# Patient Record
Sex: Female | Born: 1955 | Race: White | Hispanic: No | Marital: Married | Smoking: Never smoker
Health system: Southern US, Community
[De-identification: ages and names within clinical notes are randomized; demographics above are authoritative.]

## PROBLEM LIST (undated history)

## (undated) DIAGNOSIS — E063 Autoimmune thyroiditis: Secondary | ICD-10-CM

## (undated) DIAGNOSIS — E785 Hyperlipidemia, unspecified: Secondary | ICD-10-CM

## (undated) DIAGNOSIS — E119 Type 2 diabetes mellitus without complications: Secondary | ICD-10-CM

## (undated) HISTORY — DX: Hyperlipidemia, unspecified: E78.5

## (undated) HISTORY — PX: ABDOMINAL HYSTERECTOMY: SHX81

## (undated) HISTORY — PX: APPENDECTOMY: SHX54

## (undated) HISTORY — PX: CHOLECYSTECTOMY: SHX55

## (undated) HISTORY — PX: TONSILLECTOMY: SUR1361

## (undated) HISTORY — PX: THYROID SURGERY: SHX805

## (undated) HISTORY — DX: Type 2 diabetes mellitus without complications: E11.9

---

## 2009-04-12 DIAGNOSIS — E119 Type 2 diabetes mellitus without complications: Secondary | ICD-10-CM | POA: Insufficient documentation

## 2021-04-14 ENCOUNTER — Other Ambulatory Visit: Payer: Self-pay

## 2021-04-14 ENCOUNTER — Encounter: Payer: Self-pay | Admitting: Family Medicine

## 2021-04-14 ENCOUNTER — Ambulatory Visit (INDEPENDENT_AMBULATORY_CARE_PROVIDER_SITE_OTHER): Payer: Medicare Other | Admitting: Family Medicine

## 2021-04-14 VITALS — BP 103/70 | HR 69 | Temp 98.0°F | Resp 16 | Ht 69.0 in | Wt 168.8 lb

## 2021-04-14 DIAGNOSIS — Z7689 Persons encountering health services in other specified circumstances: Secondary | ICD-10-CM

## 2021-04-14 DIAGNOSIS — Z23 Encounter for immunization: Secondary | ICD-10-CM | POA: Diagnosis not present

## 2021-04-14 DIAGNOSIS — M79605 Pain in left leg: Secondary | ICD-10-CM | POA: Diagnosis not present

## 2021-04-14 DIAGNOSIS — E1169 Type 2 diabetes mellitus with other specified complication: Secondary | ICD-10-CM | POA: Diagnosis not present

## 2021-04-14 DIAGNOSIS — E063 Autoimmune thyroiditis: Secondary | ICD-10-CM

## 2021-04-14 LAB — POCT GLYCOSYLATED HEMOGLOBIN (HGB A1C): Hemoglobin A1C: 8.4 % — AB (ref 4.0–5.6)

## 2021-04-14 MED ORDER — METFORMIN HCL 1000 MG PO TABS: 1000.0000 mg | ORAL_TABLET | Freq: Two times a day (BID) | ORAL | 1 refills | Status: AC

## 2021-04-14 MED ORDER — LEVOTHYROXINE SODIUM 88 MCG PO TABS: 88.0000 ug | ORAL_TABLET | Freq: Every day | ORAL | 1 refills | Status: AC

## 2021-04-14 MED ORDER — PRAVASTATIN SODIUM 20 MG PO TABS: 20.0000 mg | ORAL_TABLET | Freq: Every day | ORAL | 1 refills | Status: AC

## 2021-04-14 MED ORDER — GLIPIZIDE 10 MG PO TABS: 10.0000 mg | ORAL_TABLET | Freq: Two times a day (BID) | ORAL | 1 refills | Status: AC

## 2021-04-14 NOTE — Progress Notes (Signed)
Patient is hereto est care with provider.

## 2021-04-14 NOTE — Progress Notes (Signed)
New Patient Office Visit  Subjective:  Patient ID: Allison Turner, female    DOB: [birth year]  Age: 65 y.o. MRN: [id number]  CC:  Chief Complaint  Patient presents with   Establish Care    HPI Allison Turner presents for to establish care and for review of chronic med issues with med refills. Patient has recently relocated to the area from [place]. She also reports some intermittent left leg pain and swelling for about 2 weeks. She denies known trauma or injury.    Past Medical History:  Diagnosis Date   Diabetes mellitus without complication (HCC)    Hyperlipidemia     Past Surgical History:  Procedure Laterality Date   ABDOMINAL HYSTERECTOMY     APPENDECTOMY     CHOLECYSTECTOMY     THYROID SURGERY      History reviewed. No pertinent family history.  Social History   Socioeconomic History   Marital status: Married    Spouse name: Not on file   Number of children: Not on file   Years of education: Not on file   Highest education level: Not on file  Occupational History   Not on file  Tobacco Use   Smoking status: Never   Smokeless tobacco: Never  Substance and Sexual Activity   Alcohol use: Not on file   Drug use: Not on file   Sexual activity: Not on file  Other Topics Concern   Not on file  Social History Narrative   Not on file   Social Determinants of Health   Financial Resource Strain: Not on file  Food Insecurity: Not on file  Transportation Needs: Not on file  Physical Activity: Not on file  Stress: Not on file  Social Connections: Not on file  Intimate Partner Violence: Not on file    ROS Review of Systems  Respiratory: Negative.    Cardiovascular: Negative.   All other systems reviewed and are negative.  Objective:   Today's Vitals: BP 103/70   Pulse 69   Temp 98 F (36.7 C) (Oral)   Resp 16   Ht 5\' 9"  (1.753 m)   Wt 168 lb 12.8 oz (76.6 kg)   BMI 24.93 kg/m   Physical Exam Vitals and nursing note reviewed.  Constitutional:       General: She is not in acute distress. Neck:     Thyroid: No thyromegaly.  Cardiovascular:     Rate and Rhythm: Normal rate and regular rhythm.  Pulmonary:     Effort: Pulmonary effort is normal.     Breath sounds: Normal breath sounds.  Abdominal:     Palpations: Abdomen is soft.     Tenderness: There is no abdominal tenderness.  Musculoskeletal:     Cervical back: Normal range of motion and neck supple.     Right lower leg: No edema.     Left lower leg: No tenderness. No edema.  Neurological:     General: No focal deficit present.     Mental Status: She is alert and oriented to person, place, and time.    Assessment & Plan:   1. Type 2 diabetes mellitus with other specified complication, without long-term current use of insulin (HCC) A1c elevated above goal. Discussed dietary and activity options. Monitoring labs ordered. Continue present management. Meds refilled - Basic Metabolic Panel - AST - ALT - Lipid Panel - POCT glycosylated hemoglobin (Hb A1C)  2. Hashimoto's disease Appears stable. Monitoring labs ordered. Continue present management meds refilled. - TSH -  T4, Free  3. Left leg pain Referral to vascular for further eval/mgt - Ambulatory referral to Vascular Surgery  4. Need for influenza vaccination  - Flu Vaccine QUAD 82mo+IM (Fluarix, Fluzone & Alfiuria Quad PF)  5. Encounter to establish care     Outpatient Encounter Medications as of 04/14/2021  Medication Sig   glipiZIDE (GLUCOTROL) 5 MG tablet    metFORMIN (GLUCOPHAGE) 1000 MG tablet    Thyroid (LEVOTHYROXINE-LIOTHYRONINE PO)    [DISCONTINUED] Simvastatin 40 MG/5ML SUSP    No facility-administered encounter medications on file as of 04/14/2021.    Follow-up: Return in about 3 months (around 07/15/2021) for follow up, chronic med issues.   Allison Raymond, MD

## 2021-04-15 LAB — BASIC METABOLIC PANEL
BUN/Creatinine Ratio: 20 (ref 12–28)
BUN: 14 mg/dL (ref 8–27)
CO2: 26 mmol/L (ref 20–29)
Calcium: 9.8 mg/dL (ref 8.7–10.3)
Chloride: 101 mmol/L (ref 96–106)
Creatinine, Ser: 0.7 mg/dL (ref 0.57–1.00)
Glucose: 259 mg/dL — ABNORMAL HIGH (ref 70–99)
Potassium: 4.8 mmol/L (ref 3.5–5.2)
Sodium: 139 mmol/L (ref 134–144)
eGFR: 95 mL/min/{1.73_m2} (ref >59)

## 2021-04-15 LAB — LIPID PANEL
Chol/HDL Ratio: 2.5 ratio (ref 0.0–4.4)
Cholesterol, Total: 140 mg/dL (ref 100–199)
HDL: 55 mg/dL (ref >39)
LDL Chol Calc (NIH): 59 mg/dL (ref 0–99)
Triglycerides: 152 mg/dL — ABNORMAL HIGH (ref 0–149)
VLDL Cholesterol Cal: 26 mg/dL (ref 5–40)

## 2021-04-15 LAB — TSH: TSH: 1.85 u[IU]/mL (ref 0.450–4.500)

## 2021-04-15 LAB — ALT: ALT: 18 IU/L (ref 0–32)

## 2021-04-15 LAB — T4, FREE: Free T4: 1.62 ng/dL (ref 0.82–1.77)

## 2021-04-15 LAB — AST: AST: 18 IU/L (ref 0–40)

## 2021-04-16 NOTE — Progress Notes (Signed)
LVM on patient phone to call office

## 2021-04-21 ENCOUNTER — Other Ambulatory Visit: Payer: Self-pay

## 2021-04-21 DIAGNOSIS — M7989 Other specified soft tissue disorders: Secondary | ICD-10-CM

## 2021-05-14 ENCOUNTER — Ambulatory Visit (HOSPITAL_COMMUNITY)
Admission: RE | Admit: 2021-05-14 | Discharge: 2021-05-14 | Disposition: A | Discharge status: Home / Self Care | Payer: Medicare Other | Source: Ambulatory Visit | Attending: Vascular Surgery | Admitting: Vascular Surgery

## 2021-05-14 ENCOUNTER — Ambulatory Visit: Payer: Medicare Other | Admitting: Physician Assistant

## 2021-05-14 ENCOUNTER — Encounter: Payer: Self-pay | Admitting: Physician Assistant

## 2021-05-14 ENCOUNTER — Other Ambulatory Visit: Payer: Self-pay

## 2021-05-14 VITALS — BP 109/63 | HR 78 | Temp 98.6°F | Resp 20 | Ht 69.0 in | Wt 173.6 lb

## 2021-05-14 DIAGNOSIS — M7989 Other specified soft tissue disorders: Secondary | ICD-10-CM | POA: Insufficient documentation

## 2021-05-14 DIAGNOSIS — I872 Venous insufficiency (chronic) (peripheral): Secondary | ICD-10-CM

## 2021-05-14 NOTE — Progress Notes (Signed)
Requested by:  Georganna Skeans, MD [address] [place],  [place] [address]  Reason for consultation: LLE pain/ swelling    History of Present Illness   Allison Turner is a 65 y.o. (07/21/1955) female who presents for evaluation of LLE swelling and pain x 1 month. She says it is an aching pain that is worse at the end of the day and often keeps her awake at night. She has had varicose veins in her legs for approximately 1 year and they seem to be more prominent now. Most of her swelling is around her left knee and distal left thigh. She does not really notice any swelling in her lower leg or foot. She does elevate some which helps alleviate some of the aching. She has not worn compression stockings before. She denies any itching, burning, bleeding or ulceration. She explains that she has worked many years in [employer] and 4 months ago moved here from [facility]. She says she spends a lot of time sitting at a desk typing and sitting. She does walk daily for exercise. She does not have any history of DVT  Venous symptoms include: aching, throbbing,swelling Onset/duration:  1 month  Occupation:  Higher Education  Aggravating factors: sitting, standing Alleviating factors: elevation Compression:  no Helps:  unsure Pain medications:  none Previous vein procedures:  no History of DVT:  no  Past Medical History:  Diagnosis Date   Diabetes mellitus without complication (HCC)    Hyperlipidemia     Past Surgical History:  Procedure Laterality Date   ABDOMINAL HYSTERECTOMY     APPENDECTOMY     CHOLECYSTECTOMY     THYROID SURGERY      Social History   Socioeconomic History   Marital status: Married    Spouse name: Not on file   Number of children: Not on file   Years of education: Not on file   Highest education level: Not on file  Occupational History   Not on file  Tobacco Use   Smoking status: Never   Smokeless tobacco: Never  Substance and Sexual  Activity   Alcohol use: Not on file   Drug use: Not on file   Sexual activity: Not on file  Other Topics Concern   Not on file  Social History Narrative   Not on file   Social Determinants of Health   Financial Resource Strain: Not on file  Food Insecurity: Not on file  Transportation Needs: Not on file  Physical Activity: Not on file  Stress: Not on file  Social Connections: Not on file  Intimate Partner Violence: Not on file   No family history on file.  Current Outpatient Medications  Medication Sig Dispense Refill   glipiZIDE (GLUCOTROL) 10 MG tablet Take 1 tablet (10 mg total) by mouth 2 (two) times daily before a meal. 180 tablet 1   levothyroxine (SYNTHROID) 88 MCG tablet Take 1 tablet (88 mcg total) by mouth daily. 90 tablet 1   metFORMIN (GLUCOPHAGE) 1000 MG tablet Take 1 tablet (1,000 mg total) by mouth 2 (two) times daily with a meal. 180 tablet 1   pravastatin (PRAVACHOL) 20 MG tablet Take 1 tablet (20 mg total) by mouth daily. 90 tablet 1   No current facility-administered medications for this visit.    No Known Allergies  REVIEW OF SYSTEMS (negative unless checked):   Cardiac:  []  Chest pain or chest pressure? []  Shortness of breath upon activity? []  Shortness of breath when  lying flat? []  Irregular heart rhythm?  Vascular:  []  Pain in calf, thigh, or hip brought on by walking? []  Pain in feet at night that wakes you up from your sleep? []  Blood clot in your veins? []  Leg swelling?  Pulmonary:  []  Oxygen at home? []  Productive cough? []  Wheezing?  Neurologic:  []  Sudden weakness in arms or legs? []  Sudden numbness in arms or legs? []  Sudden onset of difficult speaking or slurred speech? []  Temporary loss of vision in one eye? []  Problems with dizziness?  Gastrointestinal:  []  Blood in stool? []  Vomited blood?  Genitourinary:  []  Burning when urinating? []  Blood in urine?  Psychiatric:  []  Major depression  Hematologic:  []  Bleeding  problems? []  Problems with blood clotting?  Dermatologic:  []  Rashes or ulcers?  Constitutional:  []  Fever or chills?  Ear/Nose/Throat:  []  Change in hearing? []  Nose bleeds? []  Sore throat?  Musculoskeletal:  []  Back pain? []  Joint pain? []  Muscle pain?   Physical Examination     Vitals:   05/14/21 1441  BP: 109/63  Pulse: 78  Resp: 20  Temp: 98.6 F (37 C)  TempSrc: Temporal  SpO2: 96%  Weight: 173 lb 9.6 oz (78.7 kg)  Height: 5\' 9"  (1.753 m)   Body mass index is 25.64 kg/m.  General:  WDWN in NAD; vital signs documented above Gait: Normal HENT: WNL, normocephalic Pulmonary: normal non-labored breathing , without  wheezing Cardiac: regular HR, without  Murmurs without carotid bruit Abdomen: soft, NT, no masses Vascular Exam/Pulses:  Right Left  Radial 2+ (normal) 2+ (normal)  Femoral 2+ (normal) 2+ (normal)  Popliteal 2+ (normal) 2+ (normal)  DP 2+ (normal) 2+ (normal)  PT 2+ (normal) 2+ (normal)   Extremities: large varicose vein of left anterior thigh that traverses her thigh, around her knee and across anterior lower leg. She also has some reticular veins on both right and left leg. She does have some fullness around anterior distal thigh and around knee. without edema, without stasis pigmentation, without lipodermatosclerosis, without ulcers Musculoskeletal: no muscle wasting or atrophy  Neurologic: A&O X 3;  No focal weakness or paresthesias are detected Psychiatric:  The pt has Normal affect.  Non-invasive Vascular Imaging   BLE Venous Insufficiency Duplex (05/14/21): LLE: DVT and SVT GSV reflux SFJ and proximal thigh GSV diameter 0.21-0.37 cm SSV reflux mid calf No deep venous reflux AASV with reflux, branches off the saphenous vein in the groin and becomes varicose and tortuous in the thigh   Medical Decision Making   Allison Turner is a 65 y.o. female who presents with: LLE chronic venous insufficiency with leg pain and swelling. Her duplex  today shows no DVT or SVT. 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 However her veins are very small so she is not a candidate for a lazer ablation. I discussed possibility of stab phlebectomy with her but explained that because her varicose veins are not themselves causing her pain that I don't think this would really benefit her.  She is not interested in stab phlebectomy at this time. Based on the patient's history and examination, I recommend compression with 20-30 mmHg stockings. Due to location of her symptoms I think she would benefit from thigh high compression, but discussed with her that she could alternate between knee and thigh high as needed. Discussed daily elevation for 30 minutes above level of her heart. I also recommended continued walking regimen and refraining from prolonged sitting  or standing She can follow up as needed if she has new or concerning symptoms    Graceann Congress, PA-C [facility] Office: [phone]  05/14/2021, 2:49 PM  Clinic MD: Dickson/ Randie Heinz

## 2021-07-15 ENCOUNTER — Encounter: Payer: Medicare Other | Admitting: Family Medicine

## 2021-10-02 ENCOUNTER — Other Ambulatory Visit: Payer: Self-pay | Admitting: Internal Medicine

## 2021-10-02 DIAGNOSIS — Z1231 Encounter for screening mammogram for malignant neoplasm of breast: Secondary | ICD-10-CM

## 2021-10-12 ENCOUNTER — Ambulatory Visit
Admission: RE | Admit: 2021-10-12 | Discharge: 2021-10-12 | Disposition: A | Discharge status: Home / Self Care | Payer: Medicare Other | Source: Ambulatory Visit | Attending: Internal Medicine | Admitting: Internal Medicine

## 2021-10-12 DIAGNOSIS — Z1231 Encounter for screening mammogram for malignant neoplasm of breast: Secondary | ICD-10-CM

## 2022-07-30 ENCOUNTER — Other Ambulatory Visit (HOSPITAL_COMMUNITY): Payer: Self-pay | Admitting: Internal Medicine

## 2022-07-30 ENCOUNTER — Ambulatory Visit (HOSPITAL_COMMUNITY)
Admission: RE | Admit: 2022-07-30 | Discharge: 2022-07-30 | Disposition: A | Discharge status: Home / Self Care | Payer: Medicare Other | Source: Ambulatory Visit | Attending: Internal Medicine | Admitting: Internal Medicine

## 2022-07-30 DIAGNOSIS — R1011 Right upper quadrant pain: Secondary | ICD-10-CM

## 2022-08-02 ENCOUNTER — Other Ambulatory Visit: Payer: Self-pay | Admitting: Internal Medicine

## 2022-08-02 DIAGNOSIS — K862 Cyst of pancreas: Secondary | ICD-10-CM

## 2022-08-24 ENCOUNTER — Ambulatory Visit
Admission: RE | Admit: 2022-08-24 | Discharge: 2022-08-24 | Disposition: A | Discharge status: Home / Self Care | Payer: Medicare Other | Source: Ambulatory Visit | Attending: Internal Medicine | Admitting: Internal Medicine

## 2022-08-24 DIAGNOSIS — K862 Cyst of pancreas: Secondary | ICD-10-CM

## 2022-08-24 MED ORDER — GADOPICLENOL 0.5 MMOL/ML IV SOLN
7.0000 mL | Freq: Once | INTRAVENOUS | Status: AC | PRN
  Administered 2022-08-24: 7 mL via INTRAVENOUS

## 2022-12-02 ENCOUNTER — Other Ambulatory Visit: Payer: Self-pay | Admitting: Internal Medicine

## 2022-12-02 DIAGNOSIS — Z1231 Encounter for screening mammogram for malignant neoplasm of breast: Secondary | ICD-10-CM

## 2022-12-23 ENCOUNTER — Ambulatory Visit
Admission: RE | Admit: 2022-12-23 | Discharge: 2022-12-23 | Disposition: A | Discharge status: Home / Self Care | Payer: Medicare Other | Source: Ambulatory Visit | Attending: Internal Medicine | Admitting: Internal Medicine

## 2022-12-23 DIAGNOSIS — Z1231 Encounter for screening mammogram for malignant neoplasm of breast: Secondary | ICD-10-CM

## 2022-12-27 ENCOUNTER — Other Ambulatory Visit: Payer: Self-pay | Admitting: Internal Medicine

## 2022-12-27 DIAGNOSIS — R928 Other abnormal and inconclusive findings on diagnostic imaging of breast: Secondary | ICD-10-CM

## 2023-01-13 ENCOUNTER — Ambulatory Visit
Admission: RE | Admit: 2023-01-13 | Discharge: 2023-01-13 | Disposition: A | Discharge status: Home / Self Care | Payer: Medicare Other | Source: Ambulatory Visit | Attending: Internal Medicine | Admitting: Internal Medicine

## 2023-01-13 ENCOUNTER — Other Ambulatory Visit: Payer: Self-pay | Admitting: Internal Medicine

## 2023-01-13 DIAGNOSIS — R928 Other abnormal and inconclusive findings on diagnostic imaging of breast: Secondary | ICD-10-CM

## 2023-04-28 ENCOUNTER — Other Ambulatory Visit (HOSPITAL_BASED_OUTPATIENT_CLINIC_OR_DEPARTMENT_OTHER): Payer: Self-pay

## 2023-04-28 ENCOUNTER — Emergency Department (HOSPITAL_BASED_OUTPATIENT_CLINIC_OR_DEPARTMENT_OTHER)
Admission: EM | Admit: 2023-04-28 | Discharge: 2023-04-28 | Disposition: A | Discharge status: Home / Self Care | Payer: Medicare Other | Attending: Emergency Medicine | Admitting: Emergency Medicine

## 2023-04-28 ENCOUNTER — Encounter (HOSPITAL_BASED_OUTPATIENT_CLINIC_OR_DEPARTMENT_OTHER): Payer: Self-pay | Admitting: Emergency Medicine

## 2023-04-28 ENCOUNTER — Other Ambulatory Visit: Payer: Self-pay

## 2023-04-28 ENCOUNTER — Emergency Department (HOSPITAL_BASED_OUTPATIENT_CLINIC_OR_DEPARTMENT_OTHER): Payer: Medicare Other

## 2023-04-28 DIAGNOSIS — Z7984 Long term (current) use of oral hypoglycemic drugs: Secondary | ICD-10-CM | POA: Insufficient documentation

## 2023-04-28 DIAGNOSIS — R519 Headache, unspecified: Secondary | ICD-10-CM | POA: Insufficient documentation

## 2023-04-28 DIAGNOSIS — E119 Type 2 diabetes mellitus without complications: Secondary | ICD-10-CM | POA: Insufficient documentation

## 2023-04-28 DIAGNOSIS — R531 Weakness: Secondary | ICD-10-CM | POA: Diagnosis not present

## 2023-04-28 DIAGNOSIS — R197 Diarrhea, unspecified: Secondary | ICD-10-CM | POA: Diagnosis not present

## 2023-04-28 DIAGNOSIS — Z20822 Contact with and (suspected) exposure to covid-19: Secondary | ICD-10-CM | POA: Insufficient documentation

## 2023-04-28 HISTORY — DX: Autoimmune thyroiditis: E06.3

## 2023-04-28 LAB — CBC WITH DIFFERENTIAL/PLATELET
Abs Immature Granulocytes: 0.03 10*3/uL (ref 0.00–0.07)
Basophils Absolute: 0 10*3/uL (ref 0.0–0.1)
Basophils Relative: 0 %
Eosinophils Absolute: 0.4 10*3/uL (ref 0.0–0.5)
Eosinophils Relative: 6 %
HCT: 40.1 % (ref 36.0–46.0)
Hemoglobin: 13.9 g/dL (ref 12.0–15.0)
Immature Granulocytes: 0 %
Lymphocytes Relative: 23 %
Lymphs Abs: 1.6 10*3/uL (ref 0.7–4.0)
MCH: 32 pg (ref 26.0–34.0)
MCHC: 34.7 g/dL (ref 30.0–36.0)
MCV: 92.4 fL (ref 80.0–100.0)
Monocytes Absolute: 0.5 10*3/uL (ref 0.1–1.0)
Monocytes Relative: 7 %
Neutro Abs: 4.5 10*3/uL (ref 1.7–7.7)
Neutrophils Relative %: 64 %
Platelets: 230 10*3/uL (ref 150–400)
RBC: 4.34 MIL/uL (ref 3.87–5.11)
RDW: 12.9 % (ref 11.5–15.5)
WBC: 7 10*3/uL (ref 4.0–10.5)
nRBC: 0 % (ref 0.0–0.2)

## 2023-04-28 LAB — URINALYSIS, W/ REFLEX TO CULTURE (INFECTION SUSPECTED)
Bilirubin Urine: NEGATIVE
Glucose, UA: NEGATIVE mg/dL
Hgb urine dipstick: NEGATIVE
Ketones, ur: NEGATIVE mg/dL
Leukocytes,Ua: NEGATIVE
Nitrite: NEGATIVE
Protein, ur: NEGATIVE mg/dL
RBC / HPF: NONE SEEN RBC/hpf (ref 0–5)
Specific Gravity, Urine: 1.01 (ref 1.005–1.030)
WBC, UA: NONE SEEN WBC/hpf (ref 0–5)
pH: 6.5 (ref 5.0–8.0)

## 2023-04-28 LAB — I-STAT VENOUS BLOOD GAS, ED
Acid-Base Excess: 0 mmol/L (ref 0.0–2.0)
Bicarbonate: 24.7 mmol/L (ref 20.0–28.0)
Calcium, Ion: 1.06 mmol/L — ABNORMAL LOW (ref 1.15–1.40)
HCT: 41 % (ref 36.0–46.0)
Hemoglobin: 13.9 g/dL (ref 12.0–15.0)
O2 Saturation: 99 %
Patient temperature: 98.6
Potassium: 4.2 mmol/L (ref 3.5–5.1)
Sodium: 137 mmol/L (ref 135–145)
TCO2: 26 mmol/L (ref 22–32)
pCO2, Ven: 37.8 mm[Hg] — ABNORMAL LOW (ref 44–60)
pH, Ven: 7.423 (ref 7.25–7.43)
pO2, Ven: 152 mm[Hg] — ABNORMAL HIGH (ref 32–45)

## 2023-04-28 LAB — COMPREHENSIVE METABOLIC PANEL
ALT: 29 U/L (ref 0–44)
AST: 28 U/L (ref 15–41)
Albumin: 4.4 g/dL (ref 3.5–5.0)
Alkaline Phosphatase: 51 U/L (ref 38–126)
Anion gap: 7 (ref 5–15)
BUN: 21 mg/dL (ref 8–23)
CO2: 29 mmol/L (ref 22–32)
Calcium: 9.3 mg/dL (ref 8.9–10.3)
Chloride: 101 mmol/L (ref 98–111)
Creatinine, Ser: 0.66 mg/dL (ref 0.44–1.00)
GFR, Estimated: >60 mL/min (ref >60)
Glucose, Bld: 123 mg/dL — ABNORMAL HIGH (ref 70–99)
Potassium: 3.7 mmol/L (ref 3.5–5.1)
Sodium: 137 mmol/L (ref 135–145)
Total Bilirubin: 0.7 mg/dL (ref <1.2)
Total Protein: 7.3 g/dL (ref 6.5–8.1)

## 2023-04-28 LAB — SARS CORONAVIRUS 2 BY RT PCR: SARS Coronavirus 2 by RT PCR: NEGATIVE

## 2023-04-28 LAB — CBG MONITORING, ED: Glucose-Capillary: 150 mg/dL — ABNORMAL HIGH (ref 70–99)

## 2023-04-28 MED ORDER — LACTATED RINGERS IV BOLUS
1000.0000 mL | Freq: Once | INTRAVENOUS | Status: AC
  Administered 2023-04-28: 1000 mL via INTRAVENOUS

## 2023-04-28 MED ORDER — BLOOD GLUCOSE MONITOR SYSTEM W/DEVICE KIT
1.0000 | PACK | Freq: Three times a day (TID) | 0 refills | Status: AC
  Filled 2023-04-28: qty 1, 30d supply, fill #0

## 2023-04-28 MED ORDER — LANCET DEVICE MISC
1.0000 | Freq: Three times a day (TID) | 0 refills | Status: AC
  Filled 2023-04-28: qty 1, 30d supply, fill #0

## 2023-04-28 MED ORDER — BLOOD GLUCOSE TEST VI STRP
1.0000 | ORAL_STRIP | Freq: Three times a day (TID) | 0 refills | Status: AC
  Filled 2023-04-28: qty 100, 30d supply, fill #0

## 2023-04-28 MED ORDER — ACCU-CHEK SOFTCLIX LANCETS MISC
1.0000 | Freq: Three times a day (TID) | 0 refills | Status: AC
  Filled 2023-04-28: qty 100, 30d supply, fill #0

## 2023-04-28 NOTE — ED Notes (Signed)
Patient transported to CT 

## 2023-04-28 NOTE — ED Notes (Signed)
Pt alert and oriented X 4 at the time of discharge. RR even and unlabored. No acute distress noted. Pt verbalized understanding of discharge instructions as discussed. Pt ambulatory to lobby at time of discharge.

## 2023-04-28 NOTE — ED Provider Notes (Signed)
[facility] Provider Note   CSN: [id number] Arrival date & time: 04/28/23  4403     History  Chief Complaint  Patient presents with   Hyperglycemia    Allison Turner is a 67 y.o. female.  HPI     Feeling sick, headache, flushed cheeks Started day before yesterday Headache feels like "booming" 5/10. Nothing makes it better or worse. No nausea or vomiting Has had diarrhea started the day before yesterday, 3-4 per day. No black or bloody stools. No fever, cough, congestion Denies numbness, weakness, difficulty talking or walking, visual changes or facial droop.   Headache not worse with bright lights or loud sounds. Don't get headaches often. Flushed, cheeks becoming red at home intermittently, will get that when sick. Gets it when has covid and when glucose is high. Both glucometers were registering glucose in 400s and never came below to 264 or 340s. Could not get sugar low yesterday and this AM at 5 was 340s according to those registers Feeling weak and jittery  Past Medical History:  Diagnosis Date   Diabetes mellitus without complication (HCC)    Hashimoto's disease    Hyperlipidemia     Home Medications Prior to Admission medications   Medication Sig Start Date End Date Taking? Authorizing Provider  Blood Glucose Monitoring Suppl DEVI 1 each by Does not apply route in the morning, at noon, and at bedtime. May substitute to any manufacturer covered by patient's insurance. 04/28/23  Yes Alvira Monday, MD  Glucose Blood (BLOOD GLUCOSE TEST STRIPS) STRP 1 each by In Vitro route in the morning, at noon, and at bedtime. May substitute to any manufacturer covered by patient's insurance. 04/28/23 05/28/23 Yes Alvira Monday, MD  Lancet Device MISC 1 each by Does not apply route in the morning, at noon, and at bedtime. May substitute to any manufacturer covered by patient's insurance. 04/28/23 05/28/23 Yes Alvira Monday, MD   Lancets Misc. MISC 1 each by Does not apply route in the morning, at noon, and at bedtime. May substitute to any manufacturer covered by patient's insurance. 04/28/23 05/28/23 Yes Alvira Monday, MD  glipiZIDE (GLUCOTROL) 10 MG tablet Take 1 tablet (10 mg total) by mouth 2 (two) times daily before a meal. 04/14/21   Georganna Skeans, MD  levothyroxine (SYNTHROID) 88 MCG tablet Take 1 tablet (88 mcg total) by mouth daily. 04/14/21   Georganna Skeans, MD  metFORMIN (GLUCOPHAGE) 1000 MG tablet Take 1 tablet (1,000 mg total) by mouth 2 (two) times daily with a meal. 04/14/21   Georganna Skeans, MD  pravastatin (PRAVACHOL) 20 MG tablet Take 1 tablet (20 mg total) by mouth daily. 04/14/21   Georganna Skeans, MD      Allergies    Erythromycin    Review of Systems   Review of Systems  Physical Exam Updated Vital Signs BP (!) 112/45   Pulse (!) 51   Temp 98.2 F (36.8 C) (Oral)   Resp 18   Ht 5' 8.5" (1.74 m)   Wt 70.3 kg   SpO2 100%   BMI 23.22 kg/m  Physical Exam Vitals and nursing note reviewed.  Constitutional:      General: She is not in acute distress.    Appearance: Normal appearance. She is well-developed. She is not ill-appearing or diaphoretic.  HENT:     Head: Normocephalic and atraumatic.  Eyes:     General: No visual field deficit.    Extraocular Movements: Extraocular movements intact.  Conjunctiva/sclera: Conjunctivae normal.     Pupils: Pupils are equal, round, and reactive to light.  Cardiovascular:     Rate and Rhythm: Normal rate and regular rhythm.     Pulses: Normal pulses.     Heart sounds: Normal heart sounds. No murmur heard.    No friction rub. No gallop.  Pulmonary:     Effort: Pulmonary effort is normal. No respiratory distress.     Breath sounds: Normal breath sounds. No wheezing or rales.  Abdominal:     General: There is no distension.     Palpations: Abdomen is soft.     Tenderness: There is no abdominal tenderness. There is no guarding.   Musculoskeletal:        General: No swelling or tenderness.     Cervical back: Normal range of motion.  Skin:    General: Skin is warm and dry.     Findings: No erythema or rash.  Neurological:     General: No focal deficit present.     Mental Status: She is alert and oriented to person, place, and time.     GCS: GCS eye subscore is 4. GCS verbal subscore is 5. GCS motor subscore is 6.     Cranial Nerves: No cranial nerve deficit, dysarthria or facial asymmetry.     Sensory: No sensory deficit.     Motor: No weakness or tremor.     Coordination: Coordination normal. Finger-Nose-Finger Test normal.     Gait: Gait normal.     ED Results / Procedures / Treatments   Labs (all labs ordered are listed, but only abnormal results are displayed) Labs Reviewed  COMPREHENSIVE METABOLIC PANEL - Abnormal; Notable for the following components:      Result Value   Glucose, Bld 123 (*)    All other components within normal limits  URINALYSIS, W/ REFLEX TO CULTURE (INFECTION SUSPECTED) - Abnormal; Notable for the following components:   Color, Urine STRAW (*)    Bacteria, UA RARE (*)    All other components within normal limits  CBG MONITORING, ED - Abnormal; Notable for the following components:   Glucose-Capillary 150 (*)    All other components within normal limits  I-STAT VENOUS BLOOD GAS, ED - Abnormal; Notable for the following components:   pCO2, Ven 37.8 (*)    pO2, Ven 152 (*)    Calcium, Ion 1.06 (*)    All other components within normal limits  SARS CORONAVIRUS 2 BY RT PCR  CBC WITH DIFFERENTIAL/PLATELET    EKG EKG Interpretation Date/Time:  Thursday April 28 2023 09:42:31 EST Ventricular Rate:  54 PR Interval:  169 QRS Duration:  121 QT Interval:  478 QTC Calculation: 453 R Axis:   -54  Text Interpretation: Sinus rhythm Nonspecific IVCD with LAD Consider anterior infarct No No previous ECGs available Confirmed by Alvira Monday ([id number]) on 04/28/2023 9:48:23  AM  Radiology CT Head Wo Contrast  Result Date: 04/28/2023 CLINICAL DATA:  Headache, new onset.  Headaches for 2 days. EXAM: CT HEAD WITHOUT CONTRAST TECHNIQUE: Contiguous axial images were obtained from the base of the skull through the vertex without intravenous contrast. RADIATION DOSE REDUCTION: This exam was performed according to the departmental dose-optimization program which includes automated exposure control, adjustment of the mA and/or kV according to patient size and/or use of iterative reconstruction technique. COMPARISON:  None Available. FINDINGS: Brain: No acute infarct, hemorrhage, or mass lesion is present. No significant white matter lesions are present. Deep brain nuclei are  within normal limits. The ventricles are of normal size. No significant extraaxial fluid collection is present. The brainstem and cerebellum are within normal limits. Midline structures are within normal limits. Vascular: No hyperdense vessel or unexpected calcification. Skull: Calvarium is intact. No focal lytic or blastic lesions are present. No significant extracranial soft tissue lesion is present. Sinuses/Orbits: The paranasal sinuses and mastoid air cells are clear. The globes and orbits are within normal limits. IMPRESSION: Normal CT appearance of the brain. Electronically Signed   By: Marin Roberts M.D.   On: 04/28/2023 10:12    Procedures Procedures    Medications Ordered in ED Medications  lactated ringers bolus 1,000 mL (1,000 mLs Intravenous New Bag/Given 04/28/23 0908)    ED Course/ Medical Decision Making/ A&P                                  67 year old female with a history of type 2 diabetes, hyperlipidemia, Hashimoto's who presents with concern for high blood sugars at home, headache, flushing of the cheeks, diarrhea, generalized weakness.  Differential diagnosis includes electrolyte abnormalities, DKA, HHS, dehydration, anemia, UTI, ICH, viral etiology, thyroid abnormality.   Vital signs at this time do not suggest thyroid emergency.  No focal abnormalities on exam and have low suspicion for CVA.  No abdominal pain or tenderness.  Labs completed and personally about interpreted by me include i-STAT venous blood gas to evaluate for signs of DKA which showed a normal pH, no signs of DKA.  CBC with no leukocytosis, no anemia.  CMP without hyperglycemia or electrolyte abnormalities.   Urinalysis shows no sign of urinary tract infection  CT head completed given new onset of throbbing headache shows no evidence of intracranial hemorrhage or other acute abnormalities  EKG completed given history of generalized weakness was personally about interpreted by me and shows sinus rhythm with a rate of 54, no acute ST changes.  COVID-19 testing negative.  Discussed possible viral etiology of her symptoms of headache, diarrhea, generalized weakness.  Will give her a new prescription for glucose strips as it is possible that her strips are expired and this led to falsely high levels.  Also discussed possibility that she had hyperglycemia at home that improved prior to arrival.  Given prescription for new test strips and meter, recommend PCP follow-up. Patient discharged in stable condition with understanding of reasons to return.           Final Clinical Impression(s) / ED Diagnoses Final diagnoses:  Nonintractable headache, unspecified chronicity pattern, unspecified headache type  Generalized weakness  Diarrhea, unspecified type    Rx / DC Orders ED Discharge Orders          Ordered    Blood Glucose Monitoring Suppl DEVI  3 times daily        04/28/23 1032    Glucose Blood (BLOOD GLUCOSE TEST STRIPS) STRP  3 times daily        04/28/23 1032    Lancet Device MISC  3 times daily        04/28/23 1032    Lancets Misc. MISC  3 times daily        04/28/23 1032              Alvira Monday, MD 04/28/23 [phone]

## 2023-04-28 NOTE — ED Triage Notes (Addendum)
States, " I don't feel well and my BS yesterday was over 400 and this  am was 340" Diarrhea yesterrday, none today but has a h/a . Taking oral meds for NIDDM

## 2023-04-29 ENCOUNTER — Other Ambulatory Visit (HOSPITAL_BASED_OUTPATIENT_CLINIC_OR_DEPARTMENT_OTHER): Payer: Self-pay

## 2023-05-03 ENCOUNTER — Other Ambulatory Visit (HOSPITAL_COMMUNITY): Payer: Self-pay | Admitting: Internal Medicine

## 2023-05-03 DIAGNOSIS — R9431 Abnormal electrocardiogram [ECG] [EKG]: Secondary | ICD-10-CM

## 2023-06-07 ENCOUNTER — Ambulatory Visit (HOSPITAL_COMMUNITY): Payer: Medicare Other | Attending: Internal Medicine

## 2023-06-07 DIAGNOSIS — R9431 Abnormal electrocardiogram [ECG] [EKG]: Secondary | ICD-10-CM | POA: Diagnosis present

## 2023-06-07 LAB — ECHOCARDIOGRAM COMPLETE
Area-P 1/2: 4.8 cm2
S' Lateral: 2.6 cm

## 2024-01-02 ENCOUNTER — Encounter: Payer: Self-pay | Admitting: Internal Medicine

## 2024-01-02 DIAGNOSIS — Z1231 Encounter for screening mammogram for malignant neoplasm of breast: Secondary | ICD-10-CM

## 2024-01-03 ENCOUNTER — Other Ambulatory Visit: Payer: Self-pay | Admitting: Internal Medicine

## 2024-01-03 DIAGNOSIS — N644 Mastodynia: Secondary | ICD-10-CM

## 2024-01-11 ENCOUNTER — Ambulatory Visit
Admission: RE | Admit: 2024-01-11 | Discharge: 2024-01-11 | Disposition: A | Discharge status: Home / Self Care | Source: Ambulatory Visit | Attending: Internal Medicine | Admitting: Internal Medicine

## 2024-01-11 ENCOUNTER — Ambulatory Visit

## 2024-01-11 DIAGNOSIS — N644 Mastodynia: Secondary | ICD-10-CM

## 2024-02-26 IMAGING — MG MM DIGITAL SCREENING BILAT W/ TOMO AND CAD
6 of 10 series · 6 of 30 positions shown · non-contrast
Comparison: Previous exam(s).

CLINICAL DATA: Screening.

EXAM:
DIGITAL SCREENING BILATERAL MAMMOGRAM WITH TOMOSYNTHESIS AND CAD
TECHNIQUE: Bilateral screening digital craniocaudal and mediolateral oblique
mammograms were obtained. Bilateral screening digital breast
tomosynthesis was performed. The images were evaluated with
computer-aided detection.

[L MLO synth-2D]
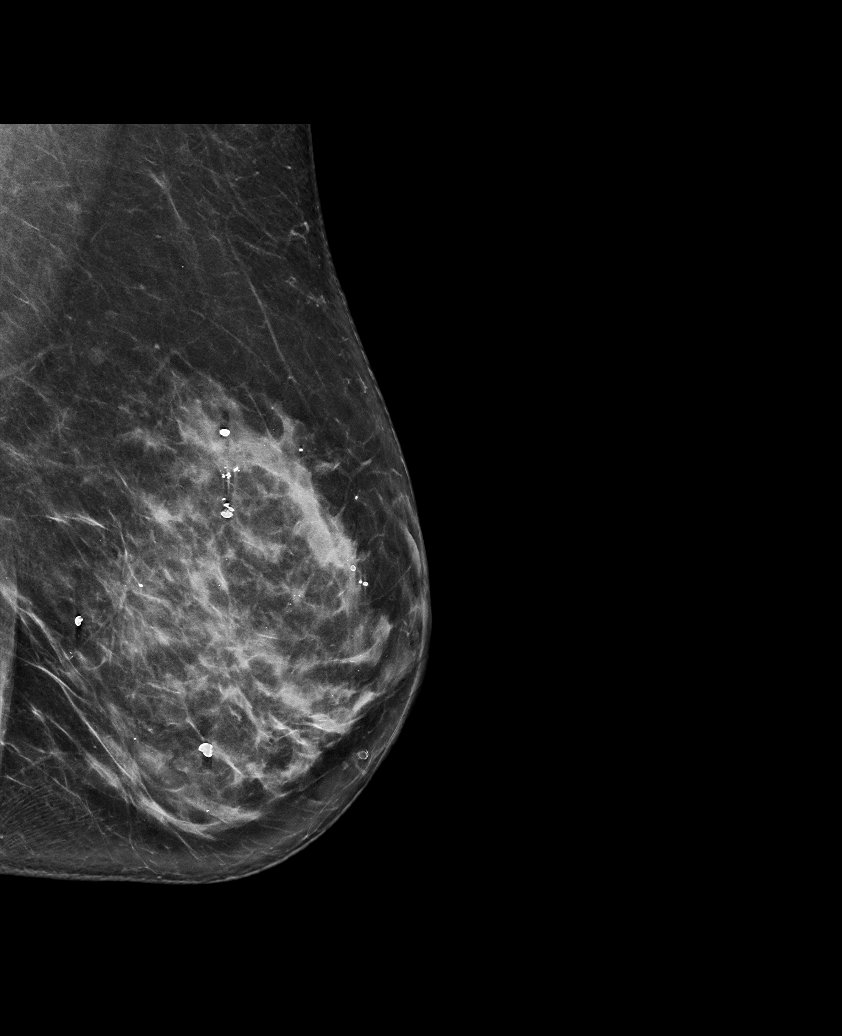

[R MLO synth-2D]
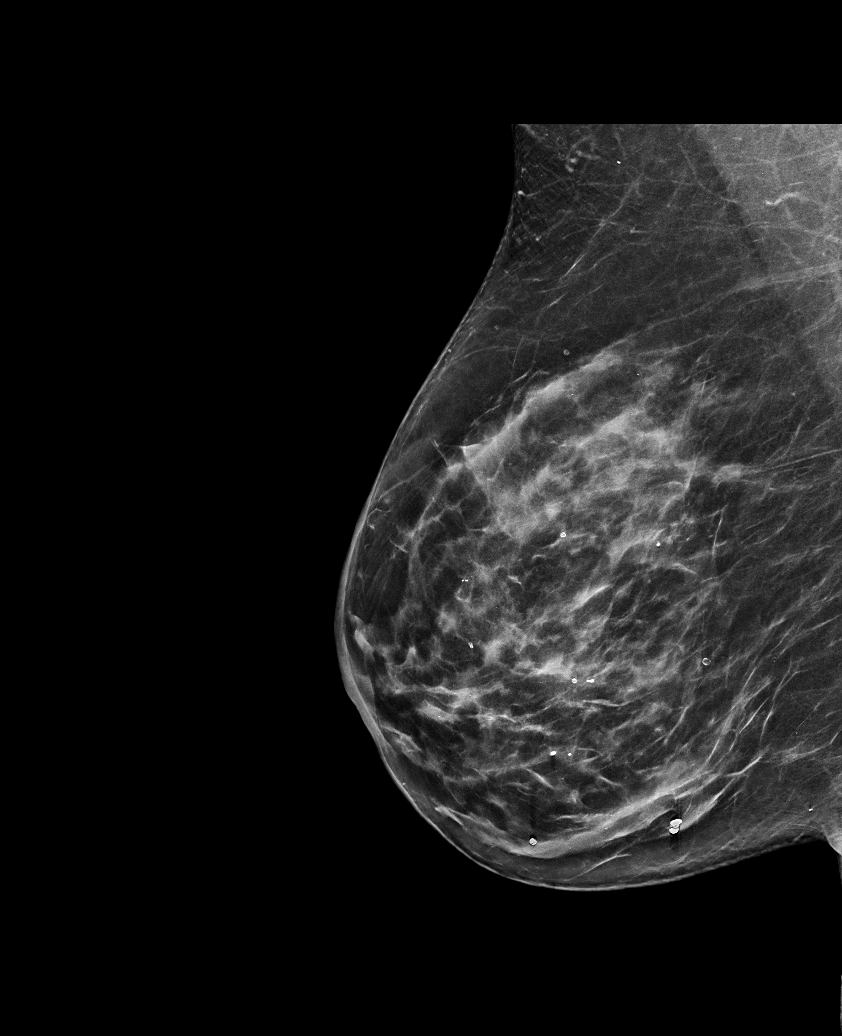

[L CC synth-2D]
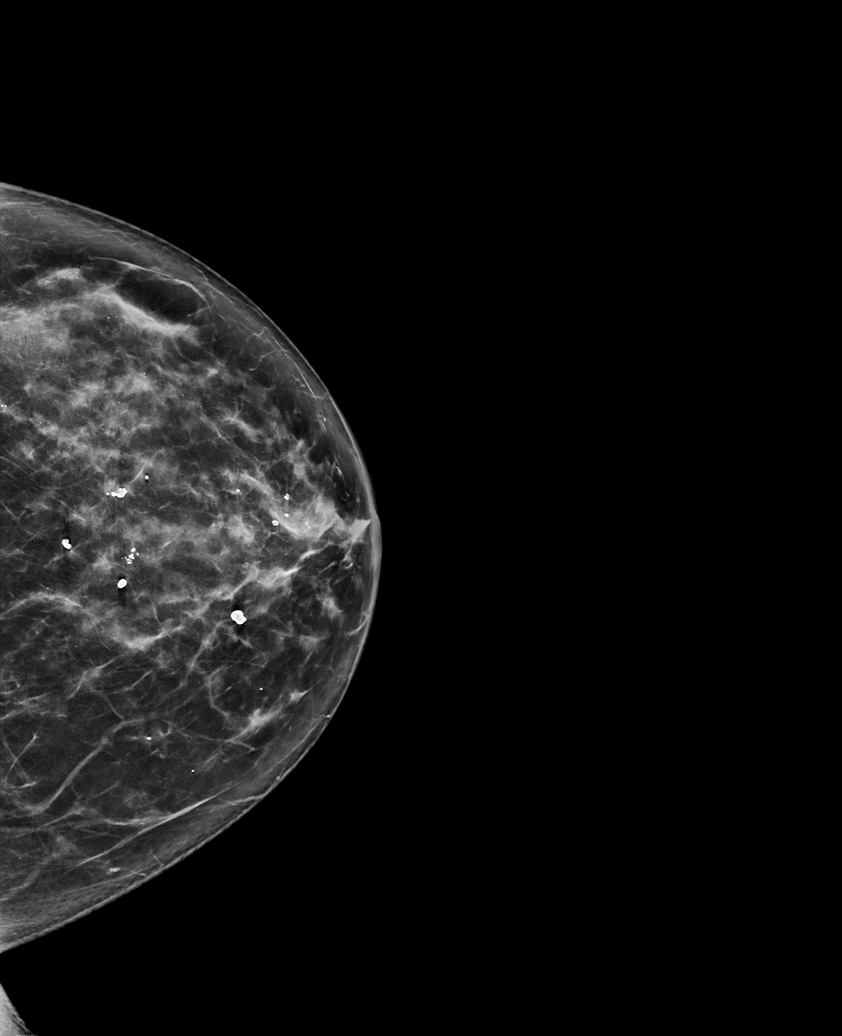

[L XCCL synth-2D]
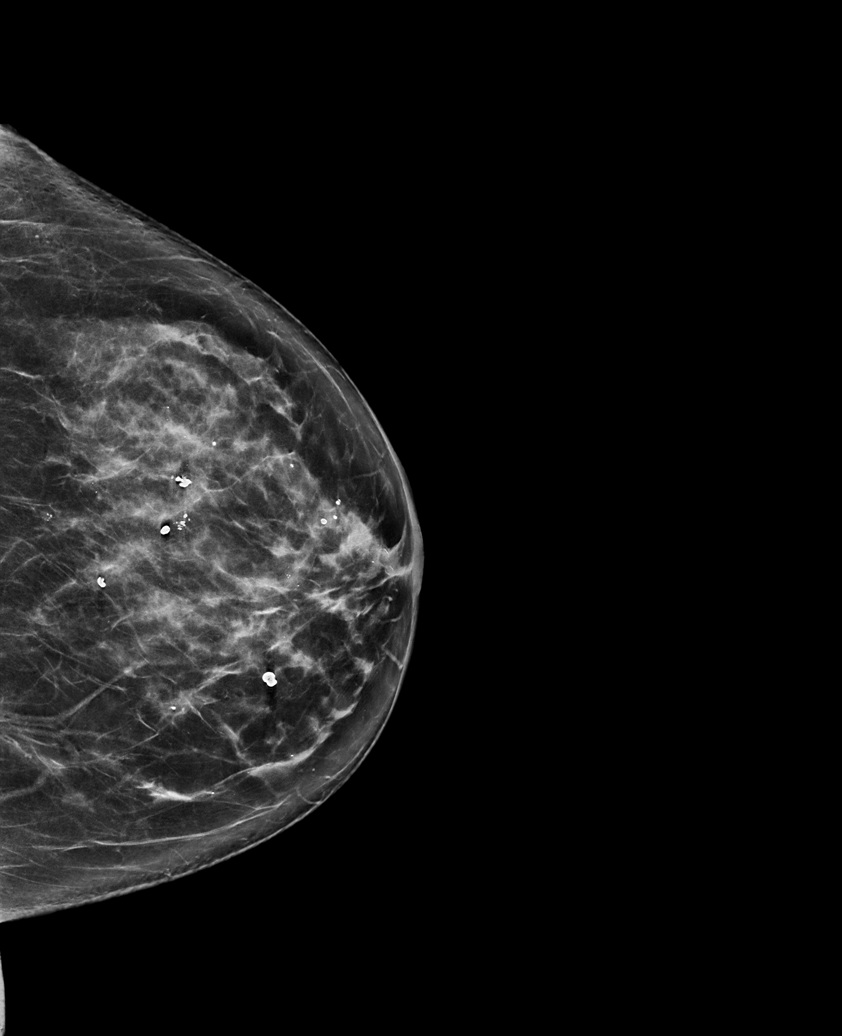

[R CC synth-2D]
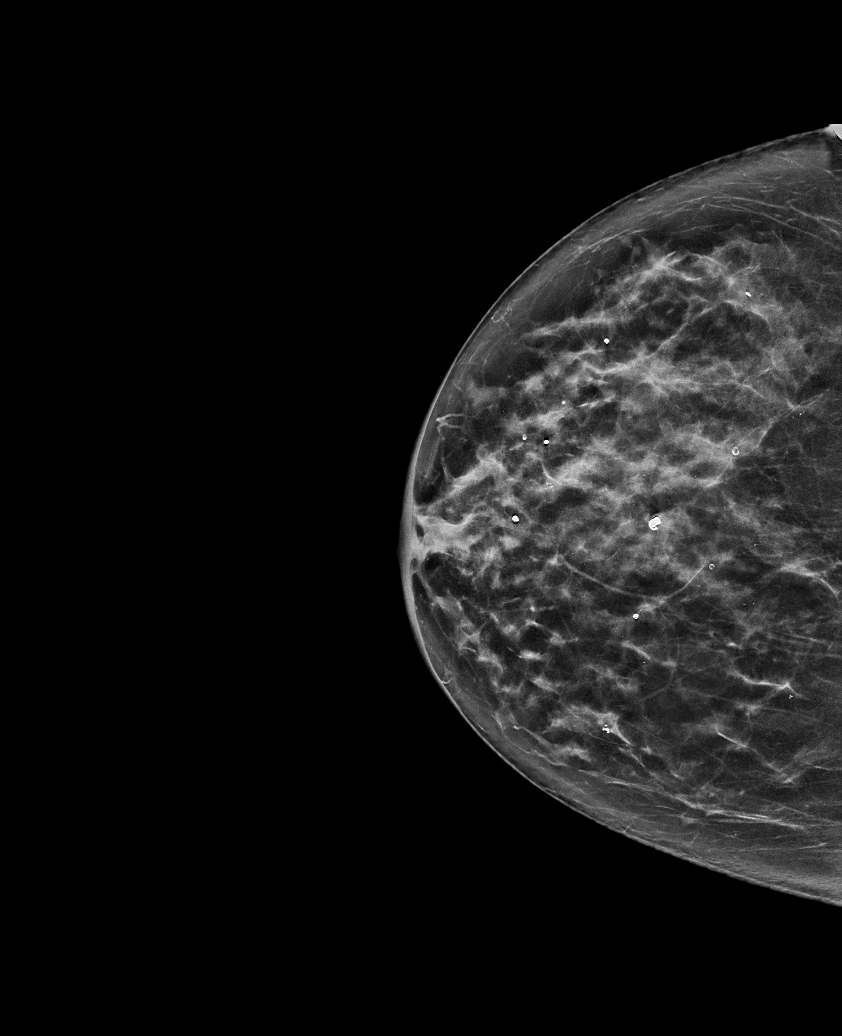

[R CC tomo · tomo slice 35/68.0]
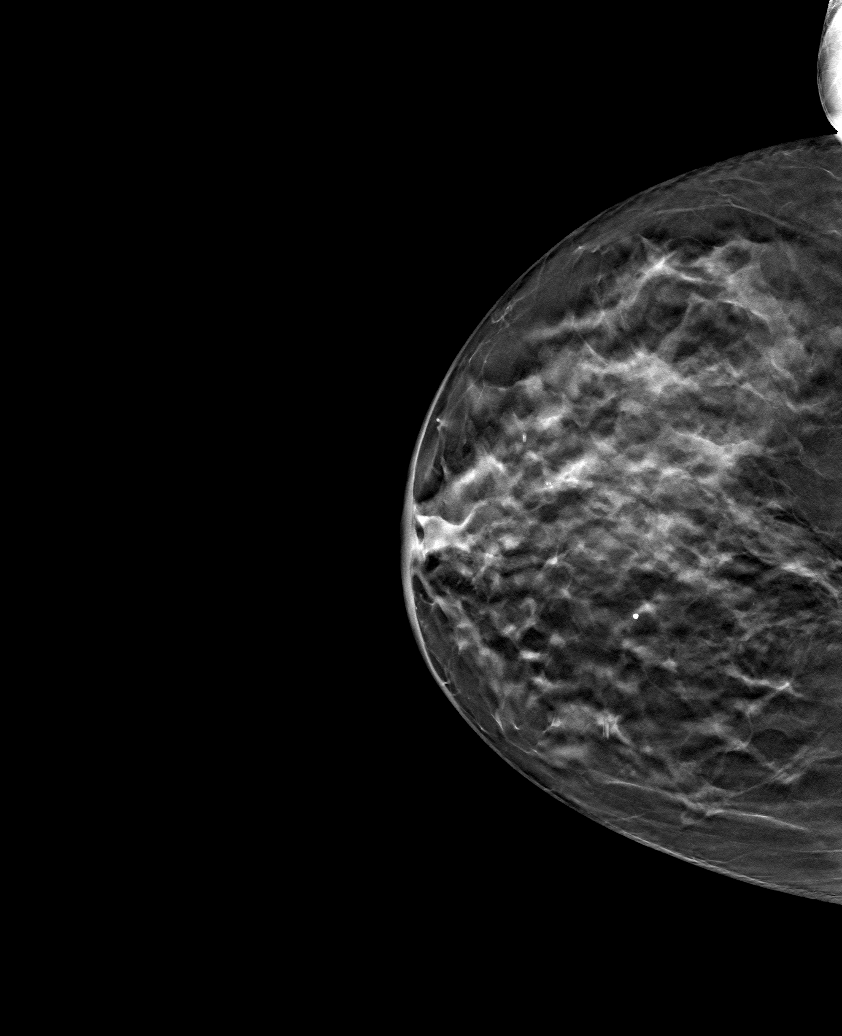

[6 of 30 positions shown; findings below may reference images not displayed]

ACR Breast Density Category c: The breast tissue is heterogeneously
dense, which may obscure small masses.
FINDINGS: There are no findings suspicious for malignancy.
IMPRESSION: No mammographic evidence of malignancy. A result letter of this
screening mammogram will be mailed directly to the patient.

RECOMMENDATION:
Screening mammogram in one year. (Code:Q3-W-BC3)

BI-RADS CATEGORY  1: Negative.
